# Patient Record
Sex: Male | Born: 1975 | Race: White | Hispanic: No | Marital: Single | Smoking: Current every day smoker
Health system: Southern US, Community
[De-identification: ages and names within clinical notes are randomized; demographics above are authoritative.]

## PROBLEM LIST (undated history)

## (undated) DIAGNOSIS — M5126 Other intervertebral disc displacement, lumbar region: Secondary | ICD-10-CM

## (undated) DIAGNOSIS — Z8489 Family history of other specified conditions: Secondary | ICD-10-CM

## (undated) DIAGNOSIS — K219 Gastro-esophageal reflux disease without esophagitis: Secondary | ICD-10-CM

## (undated) DIAGNOSIS — S62309A Unspecified fracture of unspecified metacarpal bone, initial encounter for closed fracture: Secondary | ICD-10-CM

## (undated) DIAGNOSIS — M5136 Other intervertebral disc degeneration, lumbar region: Secondary | ICD-10-CM

## (undated) HISTORY — PX: WISDOM TOOTH EXTRACTION: SHX21

## (undated) HISTORY — PX: TOOTH EXTRACTION: SHX859

---

## 2013-09-18 ENCOUNTER — Emergency Department (HOSPITAL_COMMUNITY): Payer: Self-pay

## 2013-09-18 ENCOUNTER — Emergency Department (HOSPITAL_COMMUNITY)
Admission: EM | Admit: 2013-09-18 | Discharge: 2013-09-18 | Disposition: A | Discharge status: Home / Self Care | Payer: Self-pay | Attending: Emergency Medicine | Admitting: Emergency Medicine

## 2013-09-18 ENCOUNTER — Encounter (HOSPITAL_COMMUNITY): Payer: Self-pay | Admitting: Emergency Medicine

## 2013-09-18 DIAGNOSIS — S62309A Unspecified fracture of unspecified metacarpal bone, initial encounter for closed fracture: Secondary | ICD-10-CM

## 2013-09-18 DIAGNOSIS — S63066A Dislocation of metacarpal (bone), proximal end of unspecified hand, initial encounter: Secondary | ICD-10-CM | POA: Insufficient documentation

## 2013-09-18 DIAGNOSIS — IMO0002 Reserved for concepts with insufficient information to code with codable children: Secondary | ICD-10-CM

## 2013-09-18 DIAGNOSIS — S62305A Unspecified fracture of fourth metacarpal bone, left hand, initial encounter for closed fracture: Secondary | ICD-10-CM

## 2013-09-18 DIAGNOSIS — S61409A Unspecified open wound of unspecified hand, initial encounter: Secondary | ICD-10-CM | POA: Insufficient documentation

## 2013-09-18 DIAGNOSIS — S62319A Displaced fracture of base of unspecified metacarpal bone, initial encounter for closed fracture: Secondary | ICD-10-CM | POA: Insufficient documentation

## 2013-09-18 DIAGNOSIS — S61412A Laceration without foreign body of left hand, initial encounter: Secondary | ICD-10-CM

## 2013-09-18 DIAGNOSIS — F17200 Nicotine dependence, unspecified, uncomplicated: Secondary | ICD-10-CM | POA: Insufficient documentation

## 2013-09-18 HISTORY — DX: Unspecified fracture of unspecified metacarpal bone, initial encounter for closed fracture: S62.309A

## 2013-09-18 MED ORDER — OXYCODONE-ACETAMINOPHEN 5-325 MG PO TABS
1.0000 | ORAL_TABLET | Freq: Once | ORAL | Status: AC
  Administered 2013-09-18: 1 via ORAL
  Filled 2013-09-18: qty 1

## 2013-09-18 MED ORDER — OXYCODONE-ACETAMINOPHEN 5-325 MG PO TABS: 1.0000 | ORAL_TABLET | ORAL | Status: DC | PRN

## 2013-09-18 MED ORDER — AMOXICILLIN-POT CLAVULANATE 875-125 MG PO TABS
1.0000 | ORAL_TABLET | Freq: Once | ORAL | Status: AC
  Administered 2013-09-18: 1 via ORAL
  Filled 2013-09-18: qty 1

## 2013-09-18 MED ORDER — AMOXICILLIN-POT CLAVULANATE 875-125 MG PO TABS: 1.0000 | ORAL_TABLET | Freq: Two times a day (BID) | ORAL | Status: DC

## 2013-09-18 NOTE — Progress Notes (Signed)
Orthopedic Tech Progress Note Patient Details:  Guy Schneider 05/24/1976 [id number]  Ortho Devices Type of Ortho Device: Ace wrap;Ulna gutter splint Ortho Device/Splint Interventions: Ordered;Application   Jennye Moccasin 09/18/2013, 6:16 PM
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
## 2013-09-18 NOTE — ED Provider Notes (Signed)
Medical screening examination/treatment/procedure(s) were performed by non-physician practitioner and as supervising physician I was immediately available for consultation/collaboration.  EKG Interpretation   None        Martha K Linker, MD 09/18/13 1840 

## 2013-09-18 NOTE — ED Notes (Signed)
Patient transported to X-ray 
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
## 2013-09-21 ENCOUNTER — Encounter (HOSPITAL_BASED_OUTPATIENT_CLINIC_OR_DEPARTMENT_OTHER): Payer: Self-pay | Admitting: *Deleted

## 2013-09-21 ENCOUNTER — Other Ambulatory Visit: Payer: Self-pay | Admitting: Orthopedic Surgery

## 2013-09-21 NOTE — H&P (Signed)
Guy Schneider is an 37 y.o. male.   Chief Complaint: left hand pain HPI: as above s/p left hand trauma with displaced small and ring fractures  Past Medical History  Diagnosis Date  . GERD (gastroesophageal reflux disease)     occasional - TUMS as needed  . Metacarpal bone fracture 09/18/2013    left ring and small  . Bulging lumbar disc     x 3  . Family history of anesthesia complication     pt's mother has hx. of post-op N/V    Past Surgical History  Procedure Laterality Date  . Wisdom tooth extraction    . Tooth extraction      Family History  Problem Relation Age of Onset  . Anesthesia problems Mother     post-op N/V   Social History:  reports that he has been smoking Cigarettes.  He has Schneider 20 pack-year smoking history. He has never used smokeless tobacco. He reports that he drinks about 1.2 ounces of alcohol per week. He reports that he does not use illicit drugs.  Allergies: No Known Allergies  No prescriptions prior to admission    No results found for this or any previous visit (from the past 48 hour(s)). No results found.  Review of Systems  All other systems reviewed and are negative.    Height 5' 10.5" (1.791 m), weight 80.74 kg (178 lb). Physical Exam  Constitutional: He is oriented to person, place, and time. He appears well-developed and well-nourished.  HENT:  Head: Normocephalic and atraumatic.  Cardiovascular: Normal rate.   Respiratory: Effort normal.  Musculoskeletal:       Left hand: He exhibits tenderness, bony tenderness and deformity.  Displaced left small and ring metacarpal fractures  Neurological: He is alert and oriented to person, place, and time.  Skin: Skin is warm.  Psychiatric: He has Schneider normal mood and affect. His behavior is normal. Judgment and thought content normal.     Assessment/Plan As above  Plan ORIF vs CRPP  Guy Schneider 09/21/2013, 6:54 PM

## 2013-09-22 ENCOUNTER — Ambulatory Visit (HOSPITAL_BASED_OUTPATIENT_CLINIC_OR_DEPARTMENT_OTHER)
Admission: RE | Admit: 2013-09-22 | Discharge: 2013-09-22 | Disposition: A | Discharge status: Home / Self Care | Payer: Self-pay | Source: Ambulatory Visit | Attending: Orthopedic Surgery | Admitting: Orthopedic Surgery

## 2013-09-22 ENCOUNTER — Ambulatory Visit (HOSPITAL_BASED_OUTPATIENT_CLINIC_OR_DEPARTMENT_OTHER): Payer: Self-pay | Admitting: *Deleted

## 2013-09-22 ENCOUNTER — Encounter (HOSPITAL_BASED_OUTPATIENT_CLINIC_OR_DEPARTMENT_OTHER): Payer: Self-pay | Admitting: *Deleted

## 2013-09-22 ENCOUNTER — Encounter (HOSPITAL_BASED_OUTPATIENT_CLINIC_OR_DEPARTMENT_OTHER)
Admission: RE | Disposition: A | Discharge status: Home / Self Care | Payer: Self-pay | Source: Ambulatory Visit | Attending: Orthopedic Surgery

## 2013-09-22 DIAGNOSIS — K219 Gastro-esophageal reflux disease without esophagitis: Secondary | ICD-10-CM | POA: Insufficient documentation

## 2013-09-22 DIAGNOSIS — S62309A Unspecified fracture of unspecified metacarpal bone, initial encounter for closed fracture: Secondary | ICD-10-CM

## 2013-09-22 DIAGNOSIS — X58XXXA Exposure to other specified factors, initial encounter: Secondary | ICD-10-CM | POA: Insufficient documentation

## 2013-09-22 DIAGNOSIS — S62329A Displaced fracture of shaft of unspecified metacarpal bone, initial encounter for closed fracture: Secondary | ICD-10-CM | POA: Insufficient documentation

## 2013-09-22 DIAGNOSIS — F17200 Nicotine dependence, unspecified, uncomplicated: Secondary | ICD-10-CM | POA: Insufficient documentation

## 2013-09-22 HISTORY — DX: Other intervertebral disc displacement, lumbar region: M51.26

## 2013-09-22 HISTORY — DX: Family history of other specified conditions: Z84.89

## 2013-09-22 HISTORY — DX: Other intervertebral disc degeneration, lumbar region: M51.36

## 2013-09-22 HISTORY — PX: OPEN REDUCTION INTERNAL FIXATION (ORIF) METACARPAL: SHX6234

## 2013-09-22 HISTORY — DX: Gastro-esophageal reflux disease without esophagitis: K21.9

## 2013-09-22 HISTORY — DX: Unspecified fracture of unspecified metacarpal bone, initial encounter for closed fracture: S62.309A

## 2013-09-22 SURGERY — OPEN REDUCTION INTERNAL FIXATION (ORIF) METACARPAL
Anesthesia: General | Site: Hand | Laterality: Left | Wound class: Clean

## 2013-09-22 MED ORDER — BUPIVACAINE HCL (PF) 0.25 % IJ SOLN
INTRAMUSCULAR | Status: AC
  Filled 2013-09-22: qty 30

## 2013-09-22 MED ORDER — FENTANYL CITRATE 0.05 MG/ML IJ SOLN
INTRAMUSCULAR | Status: AC
  Filled 2013-09-22: qty 4

## 2013-09-22 MED ORDER — ONDANSETRON HCL 4 MG/2ML IJ SOLN
INTRAMUSCULAR | Status: DC | PRN
  Administered 2013-09-22: 4 mg via INTRAVENOUS

## 2013-09-22 MED ORDER — BUPIVACAINE HCL (PF) 0.25 % IJ SOLN
INTRAMUSCULAR | Status: DC | PRN
  Administered 2013-09-22: 10 mL

## 2013-09-22 MED ORDER — CEFAZOLIN SODIUM-DEXTROSE 2-3 GM-% IV SOLR
INTRAVENOUS | Status: AC
  Filled 2013-09-22: qty 50

## 2013-09-22 MED ORDER — MIDAZOLAM HCL 2 MG/2ML IJ SOLN
1.0000 mg | INTRAMUSCULAR | Status: DC | PRN
  Administered 2013-09-22: 1 mg via INTRAVENOUS

## 2013-09-22 MED ORDER — LIDOCAINE HCL (PF) 1 % IJ SOLN
INTRAMUSCULAR | Status: AC
  Filled 2013-09-22: qty 30

## 2013-09-22 MED ORDER — CHLORHEXIDINE GLUCONATE 4 % EX LIQD: 60.0000 mL | Freq: Once | CUTANEOUS | Status: DC

## 2013-09-22 MED ORDER — MIDAZOLAM HCL 2 MG/2ML IJ SOLN
INTRAMUSCULAR | Status: AC
  Filled 2013-09-22: qty 2

## 2013-09-22 MED ORDER — LACTATED RINGERS IV SOLN
INTRAVENOUS | Status: DC
  Administered 2013-09-22 (×2): via INTRAVENOUS

## 2013-09-22 MED ORDER — LIDOCAINE HCL (CARDIAC) 20 MG/ML IV SOLN
INTRAVENOUS | Status: DC | PRN
  Administered 2013-09-22: 75 mg via INTRAVENOUS

## 2013-09-22 MED ORDER — HYDROMORPHONE HCL PF 1 MG/ML IJ SOLN
0.2500 mg | INTRAMUSCULAR | Status: DC | PRN
  Administered 2013-09-22 (×4): 0.5 mg via INTRAVENOUS

## 2013-09-22 MED ORDER — ONDANSETRON HCL 4 MG/2ML IJ SOLN
4.0000 mg | Freq: Four times a day (QID) | INTRAMUSCULAR | Status: DC | PRN
Start: 2013-09-22 — End: 2013-09-22

## 2013-09-22 MED ORDER — DEXAMETHASONE SODIUM PHOSPHATE 10 MG/ML IJ SOLN
INTRAMUSCULAR | Status: DC | PRN
  Administered 2013-09-22: 10 mg via INTRAVENOUS

## 2013-09-22 MED ORDER — HYDROMORPHONE HCL PF 1 MG/ML IJ SOLN
INTRAMUSCULAR | Status: AC
  Filled 2013-09-22: qty 1

## 2013-09-22 MED ORDER — KETOROLAC TROMETHAMINE 30 MG/ML IJ SOLN
INTRAMUSCULAR | Status: AC
Start: 2013-09-22 — End: 2013-09-22
  Filled 2013-09-22: qty 1

## 2013-09-22 MED ORDER — KETOROLAC TROMETHAMINE 30 MG/ML IJ SOLN
INTRAMUSCULAR | Status: AC
  Filled 2013-09-22: qty 1

## 2013-09-22 MED ORDER — MIDAZOLAM HCL 2 MG/2ML IJ SOLN
1.0000 mg | Freq: Once | INTRAMUSCULAR | Status: AC | PRN
  Administered 2013-09-22: 1 mg via INTRAVENOUS

## 2013-09-22 MED ORDER — CEFAZOLIN SODIUM-DEXTROSE 2-3 GM-% IV SOLR
2.0000 g | INTRAVENOUS | Status: AC
  Administered 2013-09-22: 2 g via INTRAVENOUS

## 2013-09-22 MED ORDER — KETOROLAC TROMETHAMINE 30 MG/ML IJ SOLN
30.0000 mg | Freq: Once | INTRAMUSCULAR | Status: AC
  Administered 2013-09-22: 30 mg via INTRAVENOUS

## 2013-09-22 MED ORDER — MIDAZOLAM HCL 2 MG/ML PO SYRP: 12.0000 mg | ORAL_SOLUTION | Freq: Once | ORAL | Status: DC | PRN

## 2013-09-22 MED ORDER — FENTANYL CITRATE 0.05 MG/ML IJ SOLN: 50.0000 ug | INTRAMUSCULAR | Status: DC | PRN

## 2013-09-22 MED ORDER — FENTANYL CITRATE 0.05 MG/ML IJ SOLN
INTRAMUSCULAR | Status: DC | PRN
  Administered 2013-09-22: 25 ug via INTRAVENOUS
  Administered 2013-09-22: 100 ug via INTRAVENOUS
  Administered 2013-09-22: 50 ug via INTRAVENOUS
  Administered 2013-09-22: 25 ug via INTRAVENOUS

## 2013-09-22 MED ORDER — PROPOFOL 10 MG/ML IV EMUL
INTRAVENOUS | Status: AC
  Filled 2013-09-22: qty 50

## 2013-09-22 MED ORDER — OXYCODONE HCL 5 MG PO TABS
ORAL_TABLET | ORAL | Status: AC
  Filled 2013-09-22: qty 1

## 2013-09-22 MED ORDER — PROPOFOL 10 MG/ML IV BOLUS
INTRAVENOUS | Status: DC | PRN
  Administered 2013-09-22: 200 mg via INTRAVENOUS

## 2013-09-22 MED ORDER — OXYCODONE HCL 5 MG/5ML PO SOLN: 5.0000 mg | Freq: Once | ORAL | Status: AC | PRN

## 2013-09-22 MED ORDER — MIDAZOLAM HCL 5 MG/5ML IJ SOLN
INTRAMUSCULAR | Status: DC | PRN
  Administered 2013-09-22: 1 mg via INTRAVENOUS

## 2013-09-22 MED ORDER — OXYCODONE HCL 5 MG PO TABS
5.0000 mg | ORAL_TABLET | Freq: Once | ORAL | Status: AC | PRN
  Administered 2013-09-22: 5 mg via ORAL

## 2013-09-22 MED ORDER — OXYCODONE-ACETAMINOPHEN 5-325 MG PO TABS: 1.0000 | ORAL_TABLET | ORAL | Status: DC | PRN

## 2013-09-22 SURGICAL SUPPLY — 74 items
APL SKNCLS STERI-STRIP NONHPOA (GAUZE/BANDAGES/DRESSINGS)
BANDAGE ELASTIC 3 VELCRO ST LF (GAUZE/BANDAGES/DRESSINGS) ×2 IMPLANT
BANDAGE ELASTIC 4 VELCRO ST LF (GAUZE/BANDAGES/DRESSINGS) ×2 IMPLANT
BANDAGE GAUZE ELAST BULKY 4 IN (GAUZE/BANDAGES/DRESSINGS) ×2 IMPLANT
BENZOIN TINCTURE PRP APPL 2/3 (GAUZE/BANDAGES/DRESSINGS) IMPLANT
BIT DRILL 1.1 (BIT) ×2
BIT DRILL 60X20X1.1XQC TMX (BIT) ×1 IMPLANT
BIT DRL 60X20X1.1XQC TMX (BIT) ×1
BLADE SURG 15 STRL LF DISP TIS (BLADE) ×1 IMPLANT
BLADE SURG 15 STRL SS (BLADE) ×2
BNDG CMPR 9X4 STRL LF SNTH (GAUZE/BANDAGES/DRESSINGS) ×1
BNDG CMPR MD 5X2 ELC HKLP STRL (GAUZE/BANDAGES/DRESSINGS)
BNDG ELASTIC 2 VLCR STRL LF (GAUZE/BANDAGES/DRESSINGS) IMPLANT
BNDG ESMARK 4X9 LF (GAUZE/BANDAGES/DRESSINGS) ×2 IMPLANT
CANISTER SUCT 1200ML W/VALVE (MISCELLANEOUS) IMPLANT
CORDS BIPOLAR (ELECTRODE) ×2 IMPLANT
COVER TABLE BACK 60X90 (DRAPES) ×2 IMPLANT
CUFF TOURNIQUET SINGLE 18IN (TOURNIQUET CUFF) ×2 IMPLANT
DECANTER SPIKE VIAL GLASS SM (MISCELLANEOUS) IMPLANT
DRAPE EXTREMITY T 121X128X90 (DRAPE) ×2 IMPLANT
DRAPE OEC MINIVIEW 54X84 (DRAPES) ×2 IMPLANT
DRAPE SURG 17X23 STRL (DRAPES) ×2 IMPLANT
DURAPREP 26ML APPLICATOR (WOUND CARE) ×2 IMPLANT
GAUZE SPONGE 4X4 16PLY XRAY LF (GAUZE/BANDAGES/DRESSINGS) IMPLANT
GAUZE XEROFORM 1X8 LF (GAUZE/BANDAGES/DRESSINGS) IMPLANT
GLOVE BIO SURGEON STRL SZ 6.5 (GLOVE) ×2 IMPLANT
GLOVE BIO SURGEON STRL SZ8 (GLOVE) ×2 IMPLANT
GLOVE BIOGEL M STRL SZ7.5 (GLOVE) ×2 IMPLANT
GLOVE BIOGEL PI IND STRL 7.0 (GLOVE) ×1 IMPLANT
GLOVE BIOGEL PI IND STRL 8 (GLOVE) ×1 IMPLANT
GLOVE BIOGEL PI INDICATOR 7.0 (GLOVE) ×1
GLOVE BIOGEL PI INDICATOR 8 (GLOVE) ×1
GOWN BRE IMP PREV XXLGXLNG (GOWN DISPOSABLE) ×2 IMPLANT
GOWN PREVENTION PLUS XLARGE (GOWN DISPOSABLE) ×2 IMPLANT
GOWN PREVENTION PLUS XXLARGE (GOWN DISPOSABLE) ×2 IMPLANT
K-WIRE .045X6 DBL TRO NS (WIRE) ×4
KWIRE .045X6 DBL TRO NS (WIRE) ×2 IMPLANT
NEEDLE HYPO 25X1 1.5 SAFETY (NEEDLE) ×2 IMPLANT
NS IRRIG 1000ML POUR BTL (IV SOLUTION) ×2 IMPLANT
PACK BASIN DAY SURGERY FS (CUSTOM PROCEDURE TRAY) ×2 IMPLANT
PAD CAST 3X4 CTTN HI CHSV (CAST SUPPLIES) ×1 IMPLANT
PAD CAST 4YDX4 CTTN HI CHSV (CAST SUPPLIES) ×1 IMPLANT
PADDING CAST ABS 4INX4YD NS (CAST SUPPLIES) ×1
PADDING CAST ABS COTTON 4X4 ST (CAST SUPPLIES) ×1 IMPLANT
PADDING CAST COTTON 3X4 STRL (CAST SUPPLIES) ×2
PADDING CAST COTTON 4X4 STRL (CAST SUPPLIES) ×2
PADDING UNDERCAST 2  STERILE (CAST SUPPLIES) IMPLANT
PLATE T SMALL 1.5MM (Plate) ×2 IMPLANT
SCREW 1.5X15MM (Screw) ×6 IMPLANT
SCREW CORTICAL 1.5X9MM WRIST (Screw) ×2 IMPLANT
SCREW NL 1.5X12 (Screw) ×4 IMPLANT
SCREW NONIOC 1.5 14M (Screw) ×2 IMPLANT
SCREW NONIOC 1.5 16M (Screw) ×2 IMPLANT
SHEET MEDIUM DRAPE 40X70 STRL (DRAPES) ×2 IMPLANT
SPLINT PLASTER CAST XFAST 4X15 (CAST SUPPLIES) IMPLANT
SPLINT PLASTER XTRA FAST SET 4 (CAST SUPPLIES)
SPONGE GAUZE 4X4 12PLY (GAUZE/BANDAGES/DRESSINGS) ×2 IMPLANT
STOCKINETTE 4X48 STRL (DRAPES) ×2 IMPLANT
STRIP CLOSURE SKIN 1/2X4 (GAUZE/BANDAGES/DRESSINGS) IMPLANT
SUCTION FRAZIER TIP 10 FR DISP (SUCTIONS) IMPLANT
SUT ETHILON 4 0 PS 2 18 (SUTURE) IMPLANT
SUT ETHILON 5 0 PS 2 18 (SUTURE) IMPLANT
SUT MERSILENE 4 0 P 3 (SUTURE) IMPLANT
SUT VIC AB 2-0 SH 27 (SUTURE) ×2
SUT VIC AB 2-0 SH 27XBRD (SUTURE) ×1 IMPLANT
SUT VIC AB 4-0 P-3 18XBRD (SUTURE) IMPLANT
SUT VIC AB 4-0 P3 18 (SUTURE)
SUT VICRYL 4-0 PS2 18IN ABS (SUTURE) ×2 IMPLANT
SUT VICRYL RAPIDE 4/0 PS 2 (SUTURE) ×2 IMPLANT
SYR BULB 3OZ (MISCELLANEOUS) ×2 IMPLANT
SYRINGE 10CC LL (SYRINGE) ×2 IMPLANT
TOWEL OR 17X24 6PK STRL BLUE (TOWEL DISPOSABLE) ×2 IMPLANT
TUBE CONNECTING 20X1/4 (TUBING) IMPLANT
UNDERPAD 30X30 INCONTINENT (UNDERPADS AND DIAPERS) ×2 IMPLANT

## 2013-09-22 NOTE — Anesthesia Preprocedure Evaluation (Signed)
Anesthesia Evaluation  Patient identified by MRN, date of birth, ID band Patient awake    Reviewed: Allergy & Precautions, H&P , NPO status , Patient's Chart, lab work & pertinent test results  History of Anesthesia Complications (+) Family history of anesthesia reaction  Airway Mallampati: II  Neck ROM: full    Dental   Pulmonary Current Smoker,          Cardiovascular negative cardio ROS      Neuro/Psych    GI/Hepatic GERD-  ,  Endo/Other    Renal/GU negative Renal ROS     Musculoskeletal   Abdominal   Peds  Hematology   Anesthesia Other Findings   Reproductive/Obstetrics                           Anesthesia Physical Anesthesia Plan  ASA: II  Anesthesia Plan: General   Post-op Pain Management:    Induction: Intravenous  Airway Management Planned: LMA  Additional Equipment:   Intra-op Plan:   Post-operative Plan:   Informed Consent: I have reviewed the patients History and Physical, chart, labs and discussed the procedure including the risks, benefits and alternatives for the proposed anesthesia with the patient or authorized representative who has indicated his/her understanding and acceptance.     Plan Discussed with: CRNA, Anesthesiologist and Surgeon  Anesthesia Plan Comments:         Anesthesia Quick Evaluation

## 2013-09-22 NOTE — Interval H&P Note (Signed)
History and Physical Interval Note:  09/22/2013 8:37 AM  Guy Schneider  has presented today for surgery, with the diagnosis of LEFT RING AND SMALL METACARPAL FRACTURE  The various methods of treatment have been discussed with the patient and family. After consideration of risks, benefits and other options for treatment, the patient has consented to  Procedure(s): OPEN REDUCTION INTERNAL FIXATION (ORIF) LEFT RING AND SMALL METACARPAL VS CLOSED REDUCTION AND PERCUTANEOUS PINNING (Left) as a surgical intervention .  The patient's history has been reviewed, patient examined, no change in status, stable for surgery.  I have reviewed the patient's chart and labs.  Questions were answered to the patient's satisfaction.     Dairl Ponder A

## 2013-09-22 NOTE — Op Note (Signed)
See note [id number]

## 2013-09-22 NOTE — Anesthesia Procedure Notes (Signed)
Procedure Name: LMA Insertion Date/Time: 09/22/2013 11:23 AM Performed by: Dairl Ponder A Pre-anesthesia Checklist: Patient identified, Emergency Drugs available, Suction available and Patient being monitored Patient Re-evaluated:Patient Re-evaluated prior to inductionOxygen Delivery Method: Circle System Utilized Preoxygenation: Pre-oxygenation with 100% oxygen Intubation Type: IV induction Ventilation: Mask ventilation without difficulty LMA: LMA inserted LMA Size: 5.0 Number of attempts: 1 Airway Equipment and Method: bite block Placement Confirmation: positive ETCO2 and breath sounds checked- equal and bilateral Tube secured with: Tape Dental Injury: Teeth and Oropharynx as per pre-operative assessment

## 2013-09-22 NOTE — Anesthesia Postprocedure Evaluation (Signed)
Anesthesia Post Note  Patient: Guy Schneider  Procedure(s) Performed: Procedure(s) (LRB): OPEN REDUCTION INTERNAL FIXATION (ORIF) LEFT RING AND SMALL METACARPAL VS CLOSED REDUCTION AND PERCUTANEOUS PINNING (Left)  Anesthesia type: General  Patient location: PACU  Post pain: Pain level controlled and Adequate analgesia  Post assessment: Post-op Vital signs reviewed, Patient's Cardiovascular Status Stable, Respiratory Function Stable, Patent Airway and Pain level controlled  Last Vitals:  Filed Vitals:   09/22/13 1400  BP: 111/68  Pulse: 82  Temp:   Resp: 17    Post vital signs: Reviewed and stable  Level of consciousness: awake, alert  and oriented  Complications: No apparent anesthesia complications

## 2013-09-22 NOTE — Transfer of Care (Signed)
Immediate Anesthesia Transfer of Care Note  Patient: Guy Schneider  Procedure(s) Performed: Procedure(s) (LRB): OPEN REDUCTION INTERNAL FIXATION (ORIF) LEFT RING AND SMALL METACARPAL VS CLOSED REDUCTION AND PERCUTANEOUS PINNING (Left)  Patient Location: PACU  Anesthesia Type: General  Level of Consciousness: awake, alert  and oriented  Airway & Oxygen Therapy: Patient Spontanous Breathing and Patient connected to face mask oxygen  Post-op Assessment: Report given to PACU RN and Post -op Vital signs reviewed and stable  Post vital signs: Reviewed and stable  Complications: No apparent anesthesia complications

## 2013-09-23 NOTE — Op Note (Signed)
Guy Schneider, Guy Schneider                ACCOUNT NO.:  [id number]  MEDICAL RECORD NO.:  [id number]  LOCATION:                               FACILITY:  [facility]  PHYSICIAN:  Artist Pais. Ashyra Cantin, M.D.DATE OF BIRTH:  1976-02-07  DATE OF PROCEDURE:  09/22/2013 DATE OF DISCHARGE:  09/22/2013                              OPERATIVE REPORT   PREOPERATIVE DIAGNOSIS:  Left small and ring finger carpometacarpal fracture dislocation.  POSTOPERATIVE DIAGNOSIS:  Left small and ring finger carpometacarpal fracture dislocation.  PROCEDURE:  Open reduction and internal fixation, left ring metacarpal carpometacarpal fracture dislocation with open pinning of left small finger carpometacarpal fracture dislocation.  SURGEON:  Artist Pais. Mina Marble, M.D.  ASSISTANT:  None.  ANESTHESIA:  General.  COMPLICATIONS:  No complications.  DRAINS:  No drains.  DESCRIPTION OF PROCEDURE:  The patient was taken to the operating suite. After induction of adequate general anesthesia, left upper extremity was prepped and draped in sterile fashion.  An Esmarch was used to exsanguinate the limb.  Tourniquet was inflated to 250 mmHg.  At this point in time, an incision was made of the ring metacarpal base longitudinally.  Skin was incised sharply.  Dissection was carried down to the 4th dorsal compartment.  The EDC tendon to the ring finger was carefully retracted ulnarly and a subperiosteal dissection of the ring metacarpal base was undertaken of the comminuted intra-articular fracture of the metacarpal shaft with a CMC dislocation.  The 5th [facility] joint was also clearly dislocated and pushed out dorsally and radially. We made a small incision in the joint capsule, reduced the 5th CMC joint, and pinned it with an 4.5 K-wire from the base of the metacarpal into the hamate.  We then did a subperiosteal dissection of the ring metacarpal base.  We achieved reduction with longitudinal traction, and downward pressure.  We then  took a 1.5 mm T-plate from the outset and fixed it dorsally with the T part at the [facility] joint surface with 3 screws into the proximal fragment and the remaining screws into the distal fragment.  The [facility] joint was then stable.  We then took an additional 4.5 K-wire and drove it from the small metacarpal base into the ring metacarpal base for more fixation of the CMC joint.  The wound was then thoroughly irrigated.  The K-wires were cut outside the skin, bent upon themselves, and the periosteum was closed with 2-0 undyed Vicryl, and the skin with 4-0 Vicryl Rapide.  Steri-Strips, 4x4s, fluffs, and ulnar gutter splint was applied.  The patient tolerated the procedure well and went to the recovery room in stable fashion.     Artist Pais Mina Marble, M.D.     MAW/MEDQ  D:  09/22/2013  T:  09/23/2013  Job:  [id number]

## 2013-09-24 ENCOUNTER — Encounter (HOSPITAL_BASED_OUTPATIENT_CLINIC_OR_DEPARTMENT_OTHER): Payer: Self-pay | Admitting: Orthopedic Surgery

## 2013-09-28 ENCOUNTER — Ambulatory Visit: Payer: Self-pay | Attending: Orthopedic Surgery | Admitting: Occupational Therapy

## 2013-09-28 DIAGNOSIS — IMO0001 Reserved for inherently not codable concepts without codable children: Secondary | ICD-10-CM | POA: Insufficient documentation

## 2013-09-28 DIAGNOSIS — Z4789 Encounter for other orthopedic aftercare: Secondary | ICD-10-CM | POA: Insufficient documentation

## 2013-09-28 DIAGNOSIS — M79609 Pain in unspecified limb: Secondary | ICD-10-CM | POA: Insufficient documentation

## 2013-09-29 ENCOUNTER — Ambulatory Visit: Payer: Self-pay | Admitting: Occupational Therapy

## 2013-12-16 ENCOUNTER — Emergency Department (HOSPITAL_COMMUNITY)
Admission: EM | Admit: 2013-12-16 | Discharge: 2013-12-17 | Disposition: A | Discharge status: Home / Self Care | Payer: Self-pay | Attending: Emergency Medicine | Admitting: Emergency Medicine

## 2013-12-16 ENCOUNTER — Encounter (HOSPITAL_COMMUNITY): Payer: Self-pay | Admitting: Emergency Medicine

## 2013-12-16 DIAGNOSIS — Z8781 Personal history of (healed) traumatic fracture: Secondary | ICD-10-CM | POA: Insufficient documentation

## 2013-12-16 DIAGNOSIS — Y9289 Other specified places as the place of occurrence of the external cause: Secondary | ICD-10-CM | POA: Insufficient documentation

## 2013-12-16 DIAGNOSIS — K219 Gastro-esophageal reflux disease without esophagitis: Secondary | ICD-10-CM | POA: Insufficient documentation

## 2013-12-16 DIAGNOSIS — F17200 Nicotine dependence, unspecified, uncomplicated: Secondary | ICD-10-CM | POA: Insufficient documentation

## 2013-12-16 DIAGNOSIS — Y9389 Activity, other specified: Secondary | ICD-10-CM | POA: Insufficient documentation

## 2013-12-16 DIAGNOSIS — T1590XA Foreign body on external eye, part unspecified, unspecified eye, initial encounter: Secondary | ICD-10-CM | POA: Insufficient documentation

## 2013-12-16 DIAGNOSIS — T1500XA Foreign body in cornea, unspecified eye, initial encounter: Secondary | ICD-10-CM | POA: Insufficient documentation

## 2013-12-16 DIAGNOSIS — Y990 Civilian activity done for income or pay: Secondary | ICD-10-CM | POA: Insufficient documentation

## 2013-12-16 DIAGNOSIS — Z8739 Personal history of other diseases of the musculoskeletal system and connective tissue: Secondary | ICD-10-CM | POA: Insufficient documentation

## 2013-12-16 NOTE — ED Notes (Signed)
Pt states at work and felt a piece of steel go into his right eye. Pt states irritation ever since.

## 2013-12-17 MED ORDER — FLUORESCEIN SODIUM 1 MG OP STRP
ORAL_STRIP | OPHTHALMIC | Status: AC
  Administered 2013-12-17: 01:00:00 2 via OPHTHALMIC
  Filled 2013-12-17: qty 2

## 2013-12-17 MED ORDER — TOBRAMYCIN 0.3 % OP SOLN: 1.0000 [drp] | OPHTHALMIC | Status: AC

## 2013-12-17 MED ORDER — TETRACAINE HCL 0.5 % OP SOLN
1.0000 [drp] | Freq: Once | OPHTHALMIC | Status: AC
  Administered 2013-12-17: 1 [drp] via OPHTHALMIC
  Filled 2013-12-17: qty 2

## 2013-12-17 MED ORDER — FLUORESCEIN SODIUM 1 MG OP STRP
2.0000 | ORAL_STRIP | Freq: Once | OPHTHALMIC | Status: AC
  Administered 2013-12-17: 2 via OPHTHALMIC
  Filled 2013-12-17: qty 2

## 2013-12-17 NOTE — ED Provider Notes (Signed)
Medical screening examination/treatment/procedure(s) were performed by non-physician practitioner and as supervising physician I was immediately available for consultation/collaboration.  EKG Interpretation   None         Joya Gaskinsonald W Laurianne Floresca, MD 12/17/13 [phone]

## 2013-12-17 NOTE — Discharge Instructions (Signed)
Eye, Foreign Body The term foreign body refers to any object near, on the surface of or in the eye that should not be there. A foreign body may be a small speck of dirt or dust, a hair or eyelash, a splinter or any object. CAUSES  Foreign bodies can get in the eye by:  Flying pieces of something that was broken or destroyed (debris).  A sudden injury (trauma) to the eye. SYMPTOMS  Symptoms depend on what the foreign body is and where it is in the eye. The most common locations are:  On the inner surface of the upper or lower eyelids or on the covering of the white part of the eye (conjunctiva). Symptoms in this location are:  Irritating and painful, especially when blinking.  Feeling like something is in the eye.  On the surface of the clear covering on the front of the eye (cornea). A corneal foreign body has symptoms that:  Are painful and irritating since the cornea is very sensitive.  Form small "rust rings" around a metallic foreign body. Metallic foreign bodies stick more firmly to the surface of the cornea.  Inside the eyeball. Infection can happen fast and can be hard to treat with antibiotics. This is an extremely dangerous situation. Foreign bodies inside the eye can threaten vision. A person may even loose their eye. Foreign bodies inside the eye may cause:  Great pain.  Immediate loss of vision. DIAGNOSIS  Foreign bodies are found during an exam by an eye specialist. Those that are on the eyelids, conjunctiva or cornea are usually (but not always) easily found. When a foreign body is inside the eyeball, a cataract may form almost right away. This makes it hard for an ophthalmologist to find the foreign body. Special tests may be needed, including ultrasound testing, X-rays and CT scans. TREATMENT   Foreign bodies that are on the eyelids, conjunctiva or cornea are often removed easily and painlessly.  If the foreign body has caused a scratch or abrasion of the cornea,  antibiotic drops, ointments and/or a tight patch called a "pressure patch" may be needed. Follow-up exams will be needed for several days until the abrasion heals.  Surgery is needed right away if the foreign body is inside the eyeball. This is a medical emergency. An antibiotic therapy will likely be given to stop an infection. HOME CARE INSTRUCTIONS  The use of eye patches is not universal. Their use varies from state to state and from caregiver to caregiver. If an eye patch was applied:  Keep the eye patch on for as long as directed by your caregiver until the follow-up appointment.  Do not remove the patch to put in medications unless instructed to do so. When replacing the patch, retape it as it was before. Follow the same procedure if the patch becomes loose.  WARNING: Do not drive or operate machinery while the eye is patched. The ability to judge distances will be impaired.  Only take over-the-counter or prescription medicines for pain, discomfort or fever as directed by the caregiver. If no eye patch was applied:  Keep the eye closed as much as possible. Do not rub the eye.  Wear dark glasses as needed to protect the eyes from bright light.  Do not wear contact lenses until the eye feels normal again, or as instructed.  Wear protective eye covering if there is a risk of eye injury. This is important when working with high speed tools.  Only take over-the-counter or   prescription medicines for pain, discomfort or fever as directed by the caregiver. SEEK IMMEDIATE MEDICAL CARE IF:   Pain increases in the eye or the vision changes.  You or your child has problems with the eye patch.  The injury to the eye appears to be getting larger.  There is discharge from the injured eye.  Swelling and/or soreness (inflammation) develops around the affected eye.  You or your child has an oral temperature above 102 F (38.9 C), not controlled by medicine.  Your baby is older than 3  months with a rectal temperature of 102 F (38.9 C) or higher.  Your baby is 3 months old or younger with a rectal temperature of 100.4 F (38 C) or higher. MAKE SURE YOU:   Understand these instructions.  Will watch your condition.  Will get help right away if you are not doing well or get worse. Document Released: 11/04/2005 Document Revised: 01/27/2012 Document Reviewed: 04/01/2013 [employer] Patient Information 2014 [employer].  

## 2013-12-17 NOTE — ED Provider Notes (Signed)
CSN: [id number]     Arrival date & time 12/16/13  2250 History   First MD Initiated Contact with Patient 12/17/13 0000     Chief Complaint  Patient presents with  . Foreign Body in Eye   (Consider location/radiation/quality/duration/timing/severity/associated sxs/prior Treatment) HPI Comments: R eye burning pain constant since yesterday night. 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 Pain started shortly after arriving home from work. No vision loss or bleeding from eye. Tetanus UTD, per patient.  Patient is a 38 y.o. male presenting with foreign body in eye. The history is provided by the patient. No language interpreter was used.  Foreign Body in Eye This is a new problem. The current episode started yesterday. The problem has been gradually worsening. Pertinent negatives include no fever or visual change. Associated symptoms comments: +clear tearing and R eye redness. Exacerbated by: blinking. Treatments tried: Magnet to eye and water flush. The treatment provided no relief.    Past Medical History  Diagnosis Date  . GERD (gastroesophageal reflux disease)     occasional - TUMS as needed  . Metacarpal bone fracture 09/18/2013    left ring and small  . Bulging lumbar disc     x 3  . Family history of anesthesia complication     pt's mother has hx. of post-op N/V   Past Surgical History  Procedure Laterality Date  . Wisdom tooth extraction    . Tooth extraction    . Open reduction internal fixation (orif) metacarpal Left 09/22/2013    Procedure: OPEN REDUCTION INTERNAL FIXATION (ORIF) LEFT RING AND SMALL METACARPAL VS CLOSED REDUCTION AND PERCUTANEOUS PINNING;  Surgeon: Marlowe Shores, MD;  Location: [facility];  Service: Orthopedics;  Laterality: Left;   Family History  Problem Relation Age of Onset  . Anesthesia problems Mother     post-op N/V   History  Substance Use Topics  . Smoking status: Current Every Day Smoker -- 1.00 packs/day  for 20 years    Types: Cigarettes  . Smokeless tobacco: Never Used  . Alcohol Use: 1.2 oz/week    2 Cans of beer per week     Comment: 3-4 days/week    Review of Systems  Constitutional: Negative for fever.  HENT: Negative for facial swelling.   Eyes: Positive for pain, discharge and redness. Negative for itching and visual disturbance.  Skin: Negative for color change.    Allergies  Review of patient's allergies indicates no known allergies.  Home Medications   Current Outpatient Rx  Name  Route  Sig  Dispense  Refill  . tobramycin (TOBREX) 0.3 % ophthalmic solution   Right Eye   Place 1 drop into the right eye every 4 (four) hours. Place one drop in the affected eye every 4 hours for 7 days   5 mL   0    BP 121/70  Pulse 80  Temp(Src) 98 F (36.7 C) (Oral)  Resp 16  SpO2 98%  Physical Exam  Nursing note and vitals reviewed. Constitutional: He is oriented to person, place, and time. He appears well-developed and well-nourished. No distress.  HENT:  Head: Normocephalic and atraumatic.  Mouth/Throat: Oropharynx is clear and moist. No oropharyngeal exudate.  Eyes: EOM are normal. Pupils are equal, round, and reactive to light. Right eye exhibits discharge (tearing). Foreign body present in the right eye. No foreign body present in the left eye. Right conjunctiva is injected. Right conjunctiva has no hemorrhage. Left conjunctiva is not injected. Left  conjunctiva has no hemorrhage. No scleral icterus.  Fundoscopic exam:      The right eye shows red reflex.       The left eye shows red reflex.  Snellen 20/20 OU; 20/25 OD; 20/25 OS +corneal FB inferior to pupil of R eye. No uptake on fluorescein staining; no evidence of corneal abrasion or ulcer. No pain with EOMs.  Neck: Normal range of motion.  Pulmonary/Chest: Effort normal. No respiratory distress.  Musculoskeletal: Normal range of motion.  Neurological: He is alert and oriented to person, place, and time.  Skin:  Skin is warm and dry. No rash noted. He is not diaphoretic. No erythema. No pallor.  Psychiatric: He has a normal mood and affect. His behavior is normal.    ED Course  FOREIGN BODY REMOVAL Date/Time: 12/17/2013 5:00 AM Performed by: Antony MaduraHUMES, Isabellamarie Randa Authorized by: Antony MaduraHUMES, Shanin Szymanowski Consent: Verbal consent obtained. written consent not obtained. The procedure was performed in an emergent situation. Risks and benefits: risks, benefits and alternatives were discussed Consent given by: patient Patient understanding: patient states understanding of the procedure being performed Patient consent: the patient's understanding of the procedure matches consent given Procedure consent: procedure consent matches procedure scheduled Relevant documents: relevant documents present and verified Test results: test results available and properly labeled Site marked: the operative site was marked Imaging studies: imaging studies available Required items: required blood products, implants, devices, and special equipment available Patient identity confirmed: verbally with patient and arm band Time out: Immediately prior to procedure a "time out" was called to verify the correct patient, procedure, equipment, support staff and site/side marked as required. Body area: eye Location details: right cornea Anesthesia: local infiltration Local anesthetic: tetracaine drops Anesthetic total: 3 drops Patient sedated: no Patient restrained: no Patient cooperative: yes Localization method: slit lamp and magnification Removal mechanism: 21 gauge needle. Eye examined with fluorescein. No fluorescein uptake. Residual rust ring present. Depth: superficial Complexity: simple 1 objects recovered. Objects recovered: metal FB Post-procedure assessment: foreign body removed Patient tolerance: Patient tolerated the procedure well with no immediate complications.   (including critical care time) Labs Review Labs Reviewed - No  data to display  Imaging Review No results found.  EKG Interpretation   None       MDM   1. Foreign body in eye    Uncomplicated foreign body of right eye, sustained wall working with a piece of steel at work. Patient states he was using protective eyewear. Visual acuity and all visual fields intact. No pain with EOMs. No uptake on fluorescein staining to suggest corneal ulcer or abrasion. Foreign body removed in the ED. Residual rust ring appreciated post procedure. Patient advised to followup with ophthalmologist today to have rust ring removed. Will place patient on tobramycin eyedrops to cover for infection. Tetanus up to date. Patient endorses improvement in eye discomfort with foreign body removal. He is stable for discharge today. Return precautions provided and patient agreeable to plan with no unaddressed concerns.    Antony MaduraKelly Fatumata Kashani, PA-C 12/17/13 [phone]

## 2015-06-21 IMAGING — CR DG HAND COMPLETE 3+V*L*
3 series · 3 of 3 positions shown · non-contrast
Comparison: None.

CLINICAL DATA: Hand injury and pain.

EXAM:
LEFT HAND - COMPLETE 3+ VIEW

[x hand pa left]
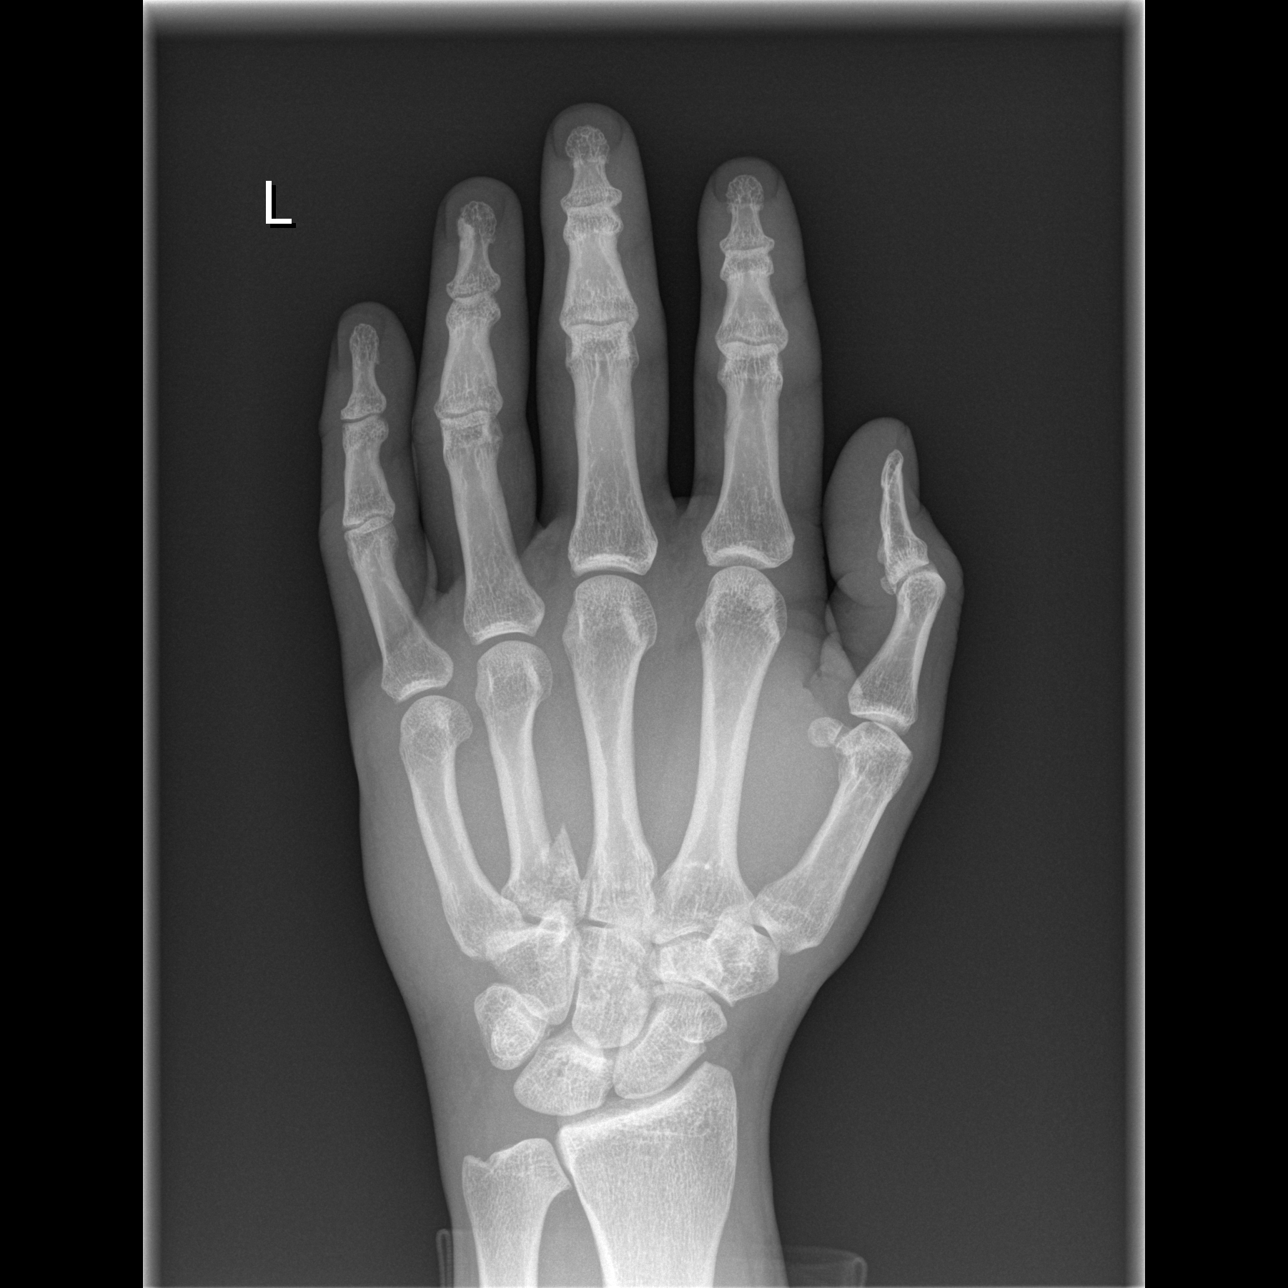

[x hand oblique left]
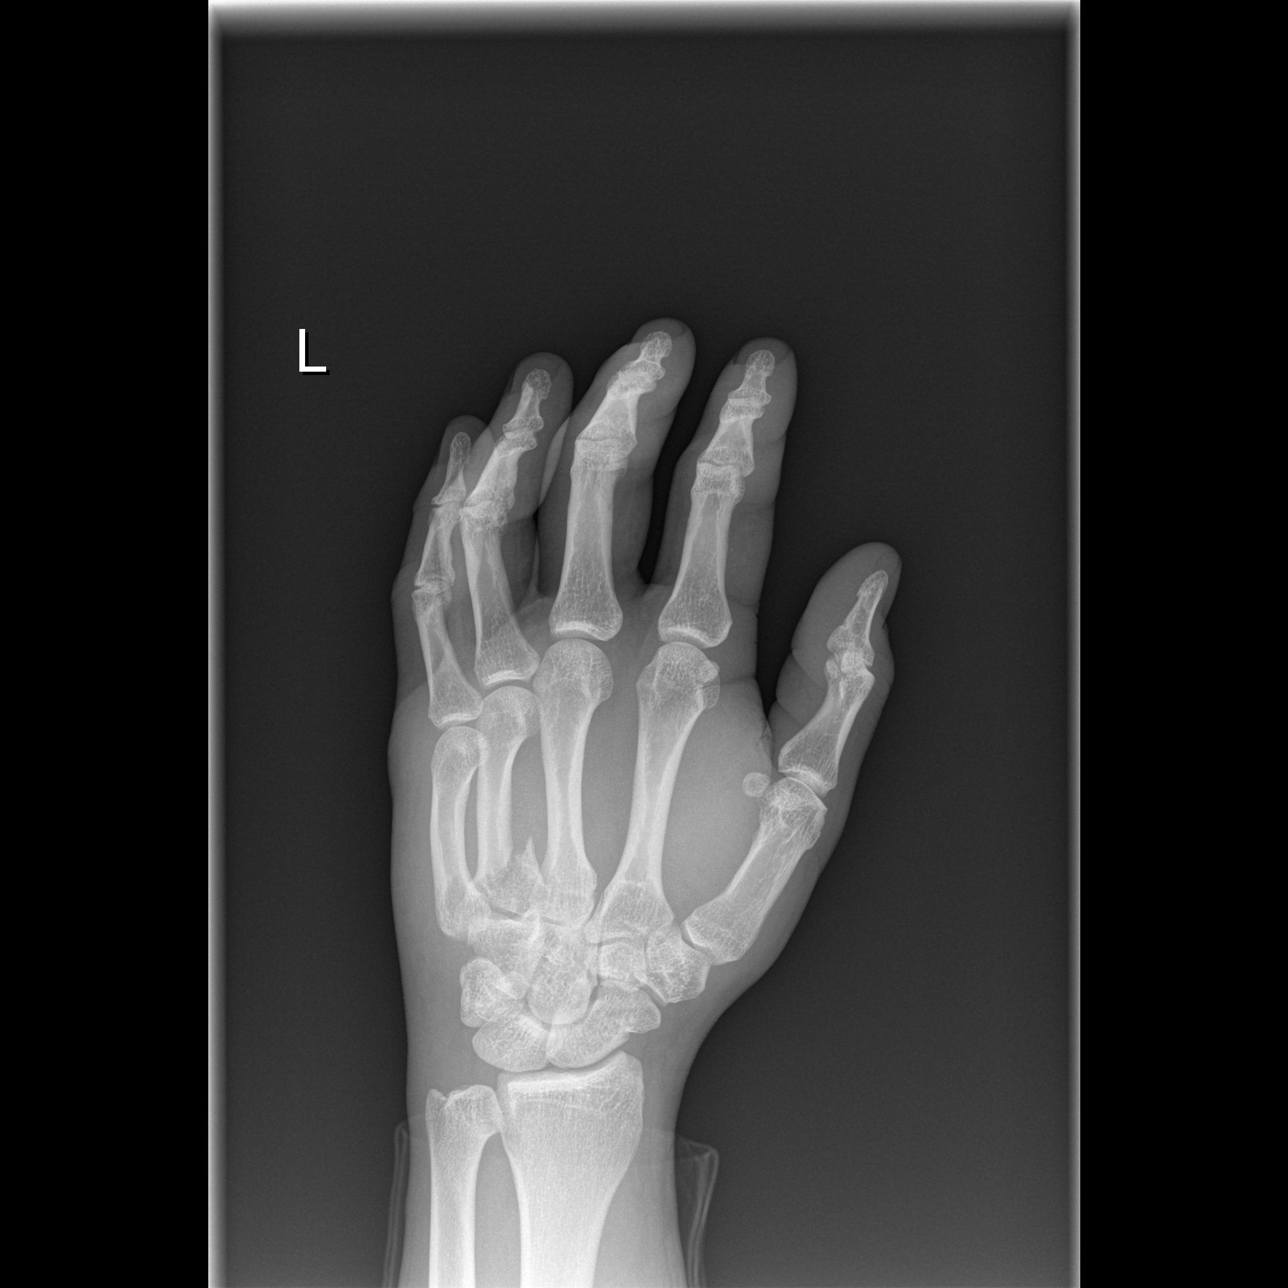

[x hand lat left]
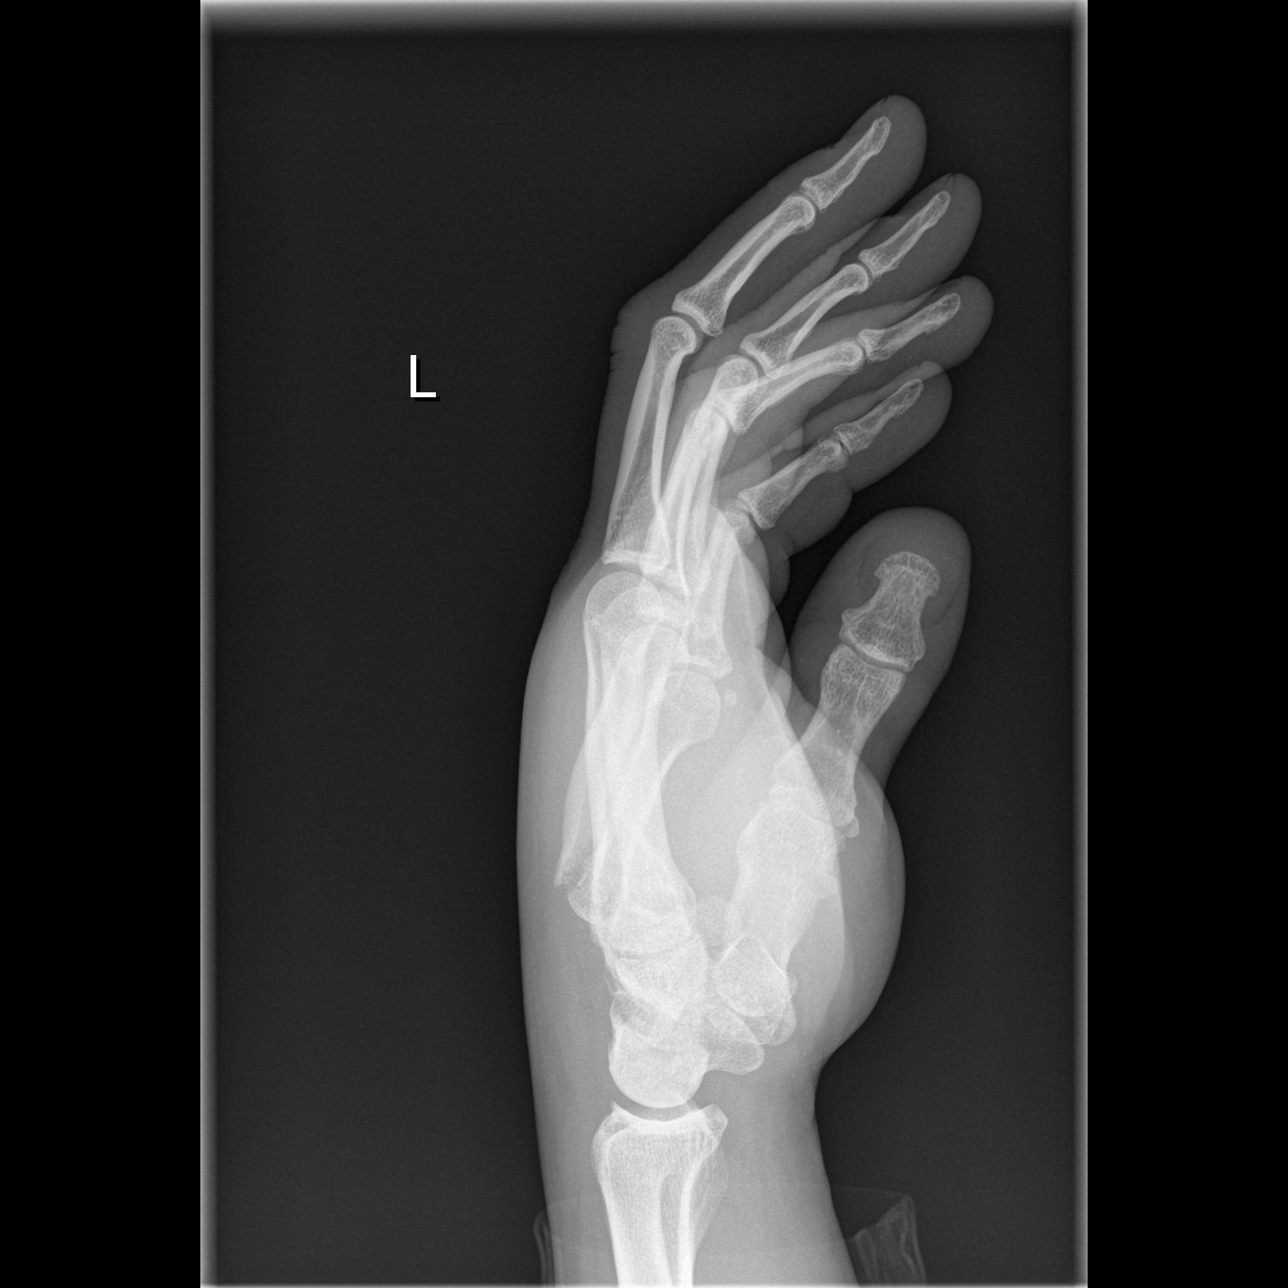

[3 of 3 positions shown; findings below may reference images not displayed]

FINDINGS: Oblique fracture is seen involving the 4th metacarpal base, with
dorsal and ulnar displacement.

Dorsal dislocation of the base of the 5th metacarpal is seen, but no
other fractures are identified.
IMPRESSION: Displaced oblique fracture of the proximal 4th metacarpal.

Dorsal dislocation of the base of the 5th metacarpal.

## 2021-05-14 ENCOUNTER — Encounter: Payer: Self-pay | Admitting: Orthopaedic Surgery

## 2021-05-14 ENCOUNTER — Ambulatory Visit: Payer: Self-pay

## 2021-05-14 ENCOUNTER — Other Ambulatory Visit: Payer: Self-pay

## 2021-05-14 ENCOUNTER — Ambulatory Visit: Payer: BLUE CROSS/BLUE SHIELD | Admitting: Orthopaedic Surgery

## 2021-05-14 DIAGNOSIS — G8929 Other chronic pain: Secondary | ICD-10-CM

## 2021-05-14 DIAGNOSIS — M5441 Lumbago with sciatica, right side: Secondary | ICD-10-CM

## 2021-05-14 DIAGNOSIS — M25551 Pain in right hip: Secondary | ICD-10-CM

## 2021-05-14 DIAGNOSIS — M4807 Spinal stenosis, lumbosacral region: Secondary | ICD-10-CM

## 2021-05-14 MED ORDER — TIZANIDINE HCL 4 MG PO TABS: 4.0000 mg | ORAL_TABLET | Freq: Three times a day (TID) | ORAL | 1 refills | Status: AC | PRN

## 2021-05-14 MED ORDER — PREDNISONE 50 MG PO TABS: ORAL_TABLET | ORAL | 0 refills | Status: AC

## 2021-05-14 MED ORDER — ACETAMINOPHEN-CODEINE #3 300-30 MG PO TABS: 1.0000 | ORAL_TABLET | Freq: Three times a day (TID) | ORAL | 0 refills | Status: AC | PRN

## 2021-05-14 NOTE — Progress Notes (Signed)
Office Visit Note   Patient: Guy Schneider           Date of Birth: September 24, 1976           MRN: [id number] Visit Date: 05/14/2021              Requested by: No referring provider defined for this encounter. PCP: Patient, No Pcp Per (Inactive)   Assessment & Plan: Visit Diagnoses:  1. Chronic right-sided low back pain with right-sided sciatica   2. 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  MRIs are warranted of both the lumbar spine and the right hip to assess the cartilage.  He is already tried and failed conservative treatment for decades now including abundant attempts to adjust his back to chiropractor treatments and other therapy modalities.  I Ernie Hew put him on 5 days of steroid combined with a muscle relaxant and some low-dose pain medicine while we await the studies.  All questions and concerns were answered addressed.  We will see him in follow-up to go over these MRIs when they are done.  Follow-Up Instructions: No follow-ups on file.   Orders:  Orders Placed This Encounter  Procedures   XR HIP UNILAT W OR W/O PELVIS 2-3 VIEWS RIGHT   XR Lumbar Spine 2-3 Views   Meds ordered this encounter  Medications   predniSONE (DELTASONE) 50 MG tablet    Sig: Take one tablet daily for 5 days.    Dispense:  5 tablet    Refill:  0   tiZANidine (ZANAFLEX) 4 MG tablet    Sig: Take 1 tablet (4 mg total) by mouth every 8 (eight) hours as needed for muscle spasms.    Dispense:  30 tablet    Refill:  1   acetaminophen-codeine (TYLENOL #3) 300-30 MG tablet    Sig: Take 1-2 tablets by mouth every 8 (eight) hours as needed.    Dispense:  30 tablet    Refill:  0      Procedures: No procedures performed   Clinical Data: No additional findings.   Subjective: Chief Complaint  Patient presents with   Right Hip - Pain   Lower Back - Pain  The patient is functioning for  the first time as a patient but have seen his father before.  He comes in with a history of chronic low back pain and acute right hip pain.  His low back pain is to the right side and does radiate down past his knee.  This is been going on for many years now.  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  He has been seeing a chiropractor for many years so has had therapy on his back on numerous occasions as well as adjustments.  He is not taking anything for pain other than ibuprofen.  He does report significant groin pain on the right hip.  I have replaced his father's hip.  At this point his pain is 10 and a 10.  He appears significantly tired from the pain has been dealing with.  He is now diabetic.  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  HPI  Review of Systems There is currently listed no headache, chest pain, shortness of breath, fever, chills,  nausea, vomiting  Objective: Vital Signs: There were no vitals taken for this visit.  Physical Exam He is alert and orient x3 and in no acute distress Ortho Exam Examination of his right low back shows pain in the paraspinal muscles and to the right side in the sciatic region on the right.  He has a positive straight leg raise on the right side.  There is radicular symptoms going down the L4 and L5 distribution on the lateral aspect of his right leg and foot.  He has significant pain in the groin with internal and external rotation of his right hip.  His left side in terms of hip and lower extremity is normal from an exam standpoint. Specialty Comments:  No specialty comments available.  Imaging: XR HIP UNILAT W OR W/O PELVIS 2-3 VIEWS RIGHT  Result Date: 05/14/2021 An AP pelvis and lateral right hip shows significant arthritis in the right hip.  There is superior lateral joint space narrowing as well as particular  osteophytes and sclerotic changes in the femoral head and acetabulum.  XR Lumbar Spine 2-3 Views  Result Date: 05/14/2021 2 views of the lumbar spine show significant degenerative changes between L4 and L5 as well as L5 and S1.  There is also loss of lumbar lordosis.    PMFS History: There are no problems to display for this patient.  Past Medical History:  Diagnosis Date   Bulging lumbar disc    x 3   Family history of anesthesia complication    pt's mother has hx. of post-op N/V   GERD (gastroesophageal reflux disease)    occasional - TUMS as needed   Metacarpal bone fracture 09/18/2013   left ring and small    Family History  Problem Relation Age of Onset   Anesthesia problems Mother        post-op N/V    Past Surgical History:  Procedure Laterality Date   OPEN REDUCTION INTERNAL FIXATION (ORIF) METACARPAL Left 09/22/2013   Procedure: OPEN REDUCTION INTERNAL FIXATION (ORIF) LEFT RING AND SMALL METACARPAL VS CLOSED REDUCTION AND PERCUTANEOUS PINNING;  Surgeon: Marlowe Shores, MD;  Location: [facility];  Service: Orthopedics;  Laterality: Left;   TOOTH EXTRACTION     WISDOM TOOTH EXTRACTION     Social History   Occupational History   Not on file  Tobacco Use   Smoking status: Every Day    Packs/day: 1.00    Years: 20.00    Pack years: 20.00    Types: Cigarettes   Smokeless tobacco: Never  Substance and Sexual Activity   Alcohol use: Yes    Alcohol/week: 2.0 standard drinks    Types: 2 Cans of beer per week    Comment: 3-4 days/week   Drug use: No   Sexual activity: Not on file

## 2021-05-22 ENCOUNTER — Telehealth: Payer: Self-pay

## 2021-05-22 ENCOUNTER — Telehealth: Payer: Self-pay | Admitting: Orthopaedic Surgery

## 2021-05-22 NOTE — Telephone Encounter (Signed)
Patient called he is requesting a rx for pain mediation to be sent in patient stated the mediation he has is not touching the pain he is requesting percocet if possible he is requesting a call back when rx has been sent call back:[phone]   patient is out of town and is requesting rx to be sent to [facility] [address] [place], [place] [address]

## 2021-05-22 NOTE — Telephone Encounter (Signed)
I called and talked to the pt. He stated understanding. He would like to know if anything else can be called in. He stated maybe a different muscle relaxer? Please advise

## 2021-05-22 NOTE — Telephone Encounter (Signed)
Please advise 

## 2021-05-22 NOTE — Telephone Encounter (Signed)
Autumn called patient and advised.

## 2021-05-22 NOTE — Telephone Encounter (Signed)
Called this into requested pharmacy. Pt stated understanding

## 2021-05-23 ENCOUNTER — Telehealth: Payer: Self-pay

## 2021-05-23 NOTE — Telephone Encounter (Signed)
Maggie from [facility] called she is requesting a order for xray of patients eyes to be faxed over,Maggie stated the patient has metal in his eyes from a previous procedure fax:[phone] call back:[phone]

## 2021-05-24 ENCOUNTER — Other Ambulatory Visit: Payer: Self-pay | Admitting: Orthopaedic Surgery

## 2021-05-24 DIAGNOSIS — W311XXA Contact with metalworking machines, initial encounter: Secondary | ICD-10-CM

## 2021-05-24 NOTE — Telephone Encounter (Signed)
I put in order for Xray orbits and faxed it over to maggie with [facility].

## 2021-05-28 ENCOUNTER — Telehealth: Payer: Self-pay | Admitting: Orthopaedic Surgery

## 2021-05-28 NOTE — Telephone Encounter (Signed)
Received call from patient, he has to find another doctor due to being out of network with [place],. He needs the following  A note stating he is currently still OOW since 6/27, awaiting MRI  X-rays on a CD has an appt Thursday with new doctor [phone]  Please call pt when ready [phone].

## 2021-05-29 NOTE — Telephone Encounter (Signed)
Ok to write oow note?

## 2021-05-29 NOTE — Telephone Encounter (Signed)
Patient called back, checking on request for oow note. He has to go to another doctor as we are out of network. Needs oow note since 6/27. Callback [phone]. (Needs by tomorrow)

## 2021-05-30 NOTE — Telephone Encounter (Signed)
Is everything else ready for pt? Letter is in chart

## 2021-05-30 NOTE — Telephone Encounter (Signed)
Lvm informing pt.

## 2021-05-30 NOTE — Telephone Encounter (Signed)
Letter completed and in chart.

## 2021-05-30 NOTE — Telephone Encounter (Signed)
I believe Lequita Halt prepared the CD so, you can call him and let him know his note is ready. Thanks

## 2021-06-01 ENCOUNTER — Telehealth: Payer: Self-pay | Admitting: Orthopaedic Surgery

## 2021-06-01 NOTE — Telephone Encounter (Signed)
Patient called. Would like to know if he can get the results from the MRI over the phone. His call back number is [phone]

## 2022-03-21 ENCOUNTER — Telehealth: Payer: Self-pay

## 2022-03-21 NOTE — Telephone Encounter (Signed)
Received referral for Sleep Consultation for patient. Patient's insurance is out of network for our office. LVM for patient and notified referring office  ?

## 2024-08-04 ENCOUNTER — Other Ambulatory Visit (HOSPITAL_BASED_OUTPATIENT_CLINIC_OR_DEPARTMENT_OTHER): Payer: Self-pay | Admitting: Internal Medicine

## 2024-08-04 DIAGNOSIS — E785 Hyperlipidemia, unspecified: Secondary | ICD-10-CM

## 2024-08-23 ENCOUNTER — Other Ambulatory Visit (HOSPITAL_BASED_OUTPATIENT_CLINIC_OR_DEPARTMENT_OTHER)
